# Patient Record
Sex: Female | Born: 1980 | Race: White | Hispanic: No | Marital: Single | Smoking: Current every day smoker
Health system: Southern US, Community
[De-identification: ages and names within clinical notes are randomized; demographics above are authoritative.]

## PROBLEM LIST (undated history)

## (undated) DIAGNOSIS — F17200 Nicotine dependence, unspecified, uncomplicated: Secondary | ICD-10-CM

## (undated) DIAGNOSIS — F32A Depression, unspecified: Secondary | ICD-10-CM

## (undated) HISTORY — DX: Nicotine dependence, unspecified, uncomplicated: F17.200

## (undated) HISTORY — DX: Depression, unspecified: F32.A

---

## 2004-02-26 ENCOUNTER — Emergency Department (HOSPITAL_COMMUNITY): Admission: EM | Admit: 2004-02-26 | Discharge: 2004-02-26 | Payer: Self-pay | Admitting: Emergency Medicine

## 2004-06-29 ENCOUNTER — Emergency Department (HOSPITAL_COMMUNITY): Admission: EM | Admit: 2004-06-29 | Discharge: 2004-06-29 | Payer: Self-pay | Admitting: Emergency Medicine

## 2004-11-06 ENCOUNTER — Emergency Department (HOSPITAL_COMMUNITY): Admission: EM | Admit: 2004-11-06 | Discharge: 2004-11-07 | Payer: Self-pay | Admitting: Emergency Medicine

## 2013-08-28 ENCOUNTER — Ambulatory Visit: Payer: Self-pay | Admitting: Internal Medicine

## 2014-07-10 DIAGNOSIS — E663 Overweight: Secondary | ICD-10-CM | POA: Insufficient documentation

## 2014-07-25 DIAGNOSIS — R87619 Unspecified abnormal cytological findings in specimens from cervix uteri: Secondary | ICD-10-CM | POA: Insufficient documentation

## 2014-08-09 DIAGNOSIS — F411 Generalized anxiety disorder: Secondary | ICD-10-CM | POA: Insufficient documentation

## 2014-08-09 DIAGNOSIS — Z975 Presence of (intrauterine) contraceptive device: Secondary | ICD-10-CM | POA: Insufficient documentation

## 2014-09-26 DIAGNOSIS — D069 Carcinoma in situ of cervix, unspecified: Secondary | ICD-10-CM | POA: Insufficient documentation

## 2017-09-02 DIAGNOSIS — N879 Dysplasia of cervix uteri, unspecified: Secondary | ICD-10-CM | POA: Insufficient documentation

## 2017-12-02 ENCOUNTER — Encounter: Payer: Self-pay | Admitting: Emergency Medicine

## 2017-12-02 ENCOUNTER — Emergency Department
Admission: EM | Admit: 2017-12-02 | Discharge: 2017-12-02 | Disposition: A | Discharge status: Home / Self Care | Payer: Self-pay | Attending: Emergency Medicine | Admitting: Emergency Medicine

## 2017-12-02 ENCOUNTER — Other Ambulatory Visit: Payer: Self-pay

## 2017-12-02 ENCOUNTER — Emergency Department: Payer: Self-pay

## 2017-12-02 DIAGNOSIS — R42 Dizziness and giddiness: Secondary | ICD-10-CM | POA: Insufficient documentation

## 2017-12-02 DIAGNOSIS — F17200 Nicotine dependence, unspecified, uncomplicated: Secondary | ICD-10-CM | POA: Insufficient documentation

## 2017-12-02 DIAGNOSIS — F419 Anxiety disorder, unspecified: Secondary | ICD-10-CM | POA: Insufficient documentation

## 2017-12-02 DIAGNOSIS — R5381 Other malaise: Secondary | ICD-10-CM | POA: Insufficient documentation

## 2017-12-02 DIAGNOSIS — R0789 Other chest pain: Secondary | ICD-10-CM | POA: Insufficient documentation

## 2017-12-02 DIAGNOSIS — R5383 Other fatigue: Secondary | ICD-10-CM | POA: Insufficient documentation

## 2017-12-02 LAB — CBC
HCT: 40.3 % (ref 35.0–47.0)
Hemoglobin: 13.6 g/dL (ref 12.0–16.0)
MCH: 30.8 pg (ref 26.0–34.0)
MCHC: 33.8 g/dL (ref 32.0–36.0)
MCV: 91.3 fL (ref 80.0–100.0)
Platelets: 301 10*3/uL (ref 150–440)
RBC: 4.42 MIL/uL (ref 3.80–5.20)
RDW: 13.2 % (ref 11.5–14.5)
WBC: 5.3 10*3/uL (ref 3.6–11.0)

## 2017-12-02 LAB — COMPREHENSIVE METABOLIC PANEL
ALT: 16 U/L (ref 14–54)
AST: 20 U/L (ref 15–41)
Albumin: 4.6 g/dL (ref 3.5–5.0)
Alkaline Phosphatase: 53 U/L (ref 38–126)
Anion gap: 9 (ref 5–15)
BUN: 11 mg/dL (ref 6–20)
CO2: 25 mmol/L (ref 22–32)
Calcium: 9.5 mg/dL (ref 8.9–10.3)
Chloride: 104 mmol/L (ref 101–111)
Creatinine, Ser: 0.77 mg/dL (ref 0.44–1.00)
GFR calc Af Amer: >60 mL/min (ref >60)
GFR calc non Af Amer: >60 mL/min (ref >60)
Glucose, Bld: 84 mg/dL (ref 65–99)
Potassium: 3.7 mmol/L (ref 3.5–5.1)
Sodium: 138 mmol/L (ref 135–145)
Total Bilirubin: 0.5 mg/dL (ref 0.3–1.2)
Total Protein: 7.8 g/dL (ref 6.5–8.1)

## 2017-12-02 LAB — TROPONIN I: Troponin I: <0.03 ng/mL (ref <0.03)

## 2017-12-02 NOTE — Discharge Instructions (Signed)
You have been seen in the Emergency Department (ED) today for chest pain as well as generalized pain throughout your body.  As we have discussed today?s test results are normal, and we believe your pain is due to pain/strain and/or inflammation of the muscles and/or cartilage of your chest wall, but stress and anxiety likely play a major role.  We recommend you take ibuprofen 600 mg three times a day with meals for the next 5 days (unless you have been told previously not to take ibuprofen or NSAIDs in general).  You may also take Tylenol according to the label instructions.  Read through the included information for additional treatment recommendations and precautions.  Continue to take your regular medications.   Return to the Emergency Department (ED) if you experience any further chest pain/pressure/tightness, difficulty breathing, or sudden sweating, or other symptoms that concern you.

## 2017-12-02 NOTE — ED Notes (Signed)
Pt reports chest discomfort, cough and back pain.  Sx for 2-3 days.  No fever.  cig smoker.  Pt alert.

## 2017-12-02 NOTE — ED Provider Notes (Signed)
[facility] Emergency Department Provider Note  ____________________________________________   First MD Initiated Contact with Patient 12/02/17 2144     (approximate)  I have reviewed the triage vital signs and the nursing notes.   HISTORY  Chief Complaint Chest Pain    HPI Emily Rojas is a 37 y.o. female with no significant past medical history who presents for evaluation of a variety of complaints.  Most notably she complains of tenderness all across her anterior upper chest wall, but in general she complains of at least a week of general malaise, fatigue, pain all over her body, occasional dizziness.  She has never had symptoms like this before which concerned her, but she thinks it may be due to anxiety and stress.  She is going through difficult time with the father of her child and additional stress in other areas, and she said that her symptoms seem similar to her sister who suffered from panic attacks.  Nothing in particular makes his symptoms better or worse although the chest pain seemed to be worse after doing a repetitive motion all day at work yesterday.  She denies fever/chills, neck pain, shortness of breath, cough, nausea, vomiting, and abdominal pain, although she says her whole body feels achy.  She says that her symptoms seem worse when she is dealing with the situation regarding her son's father.  History reviewed. No pertinent past medical history.  There are no active problems to display for this patient.   History reviewed. No pertinent surgical history.  Prior to Admission medications   Not on File    Allergies Patient has no known allergies.  History reviewed. No pertinent family history.  Social History Social History   Tobacco Use  . Smoking status: Current Every Day Smoker  . Smokeless tobacco: Never Used  Substance Use Topics  . Alcohol use: Not on file  . Drug use: Not on file    Review of  Systems Constitutional: No fever/chills.  General malaise and fatigue and myalgia as described above Eyes: No visual changes. ENT: No sore throat. Cardiovascular: Upper anterior chest wall tenderness as described above Respiratory: Denies shortness of breath. Gastrointestinal: No abdominal pain.  No nausea, no vomiting.  No diarrhea.  No constipation. Genitourinary: Negative for dysuria. Musculoskeletal: Negative for neck pain.  Negative for back pain. Integumentary: Negative for rash. Neurological: Negative for headaches, focal weakness or numbness. Psychiatric:Anxiety but adamantly denies depression ____________________________________________   PHYSICAL EXAM:  VITAL SIGNS: ED Triage Vitals  Enc Vitals Group     BP 12/02/17 2037 120/86     Pulse Rate 12/02/17 2037 75     Resp 12/02/17 2037 20     Temp 12/02/17 2037 98.1 F (36.7 C)     Temp Source 12/02/17 2037 Oral     SpO2 12/02/17 2037 100 %     Weight 12/02/17 2035 88.5 kg (195 lb)     Height 12/02/17 2035 1.702 m (5\' 7" )     Head Circumference --      Peak Flow --      Pain Score 12/02/17 2035 10     Pain Loc --      Pain Edu? --      Excl. in GC? --     Constitutional: Alert and oriented. Well appearing and in no acute distress. Eyes: Conjunctivae are normal.  Head: Atraumatic. Nose: No congestion/rhinnorhea. Mouth/Throat: Mucous membranes are moist. Neck: No stridor.  No meningeal signs.   Cardiovascular: Normal rate,  regular rhythm. Good peripheral circulation. Grossly normal heart sounds.  Highly reproducible upper anterior chest wall tenderness to palpation on the right side Respiratory: Normal respiratory effort.  No retractions. Lungs CTAB. Gastrointestinal: Soft and nontender. No distention.  Musculoskeletal: No lower extremity tenderness nor edema. No gross deformities of extremities. Neurologic:  Normal speech and language. No gross focal neurologic deficits are appreciated.  Skin:  Skin is warm, dry  and intact. No rash noted. Psychiatric: Mood and affect seem depressed although the patient denies that adamantly.  Somewhat anxious but appropriate under the social circumstances  ____________________________________________   LABS (all labs ordered are listed, but only abnormal results are displayed)  Labs Reviewed  CBC  COMPREHENSIVE METABOLIC PANEL  TROPONIN I   ____________________________________________  EKG  ED ECG REPORT I, Loleta Roseory Savas Elvin, the attending physician, personally viewed and interpreted this ECG.  Date: 12/02/2017 EKG Time: 20: 32 Rate: 68 Rhythm: normal sinus rhythm QRS Axis: normal Intervals: normal ST/T Wave abnormalities: normal Narrative Interpretation: no evidence of acute ischemia  ____________________________________________  RADIOLOGY   ED MD interpretation: No indication of active disease or emergent medical condition  Official radiology report(s): Dg Chest 2 View  Result Date: 12/02/2017 CLINICAL DATA:  Chest pain EXAM: CHEST - 2 VIEW COMPARISON:  None. FINDINGS: The heart size and mediastinal contours are within normal limits. Both lungs are clear. The visualized skeletal structures are unremarkable. IMPRESSION: No active cardiopulmonary disease. Electronically Signed   By: Alcide CleverMark  Lukens M.D.   On: 12/02/2017 20:58    ____________________________________________   PROCEDURES  Critical Care performed: No   Procedure(s) performed:   Procedures   ____________________________________________   INITIAL IMPRESSION / ASSESSMENT AND PLAN / ED COURSE  As part of my medical decision making, I reviewed the following data within the electronic MEDICAL RECORD NUMBER Nursing notes reviewed and incorporated, Labs reviewed  and EKG interpreted     Differential diagnosis includes, but is not limited to, musculoskeletal tenderness, anxiety/panic attacks, depression, less likely ACS, pulmonary embolism, pneumonia, pericarditis.  Her lab work is all  within normal limits including a negative troponin, her EKG is normal, and her chest x-ray is normal.  We talked about her symptoms for a while on a bout of the possibility of depression as well as her suggestion of anxiety and panic attacks.  She is adamant that she is not depressed but I pointed out she could be depressed and not realize it.  She seemed to feel better after we talked about all of her symptoms and issues for a while and she felt reassured that her workup was normal.  She is low risk on HEART score and PERC negative.  There is no indication of any emergent medical condition at this time and she is comfortable with the plan for discharge and outpatient follow-up.  I gave my usual and customary return precautions.      ____________________________________________  FINAL CLINICAL IMPRESSION(S) / ED DIAGNOSES  Final diagnoses:  Chest wall pain  Malaise and fatigue  Anxiety     MEDICATIONS GIVEN DURING THIS VISIT:  Medications - No data to display   ED Discharge Orders    None       Note:  This document was prepared using Dragon voice recognition software and may include unintentional dictation errors.    Loleta RoseForbach, Fontaine Hehl, MD 12/02/17 2324

## 2017-12-02 NOTE — ED Triage Notes (Signed)
Pt in with co cp that started today with some dizziness. No hx of heart disease, and recentl illness.

## 2018-11-13 IMAGING — CR DG CHEST 2V
2 series · 2 of 2 positions shown · non-contrast
Comparison: None.

CLINICAL DATA: Chest pain

EXAM:
CHEST - 2 VIEW

[chest pa]
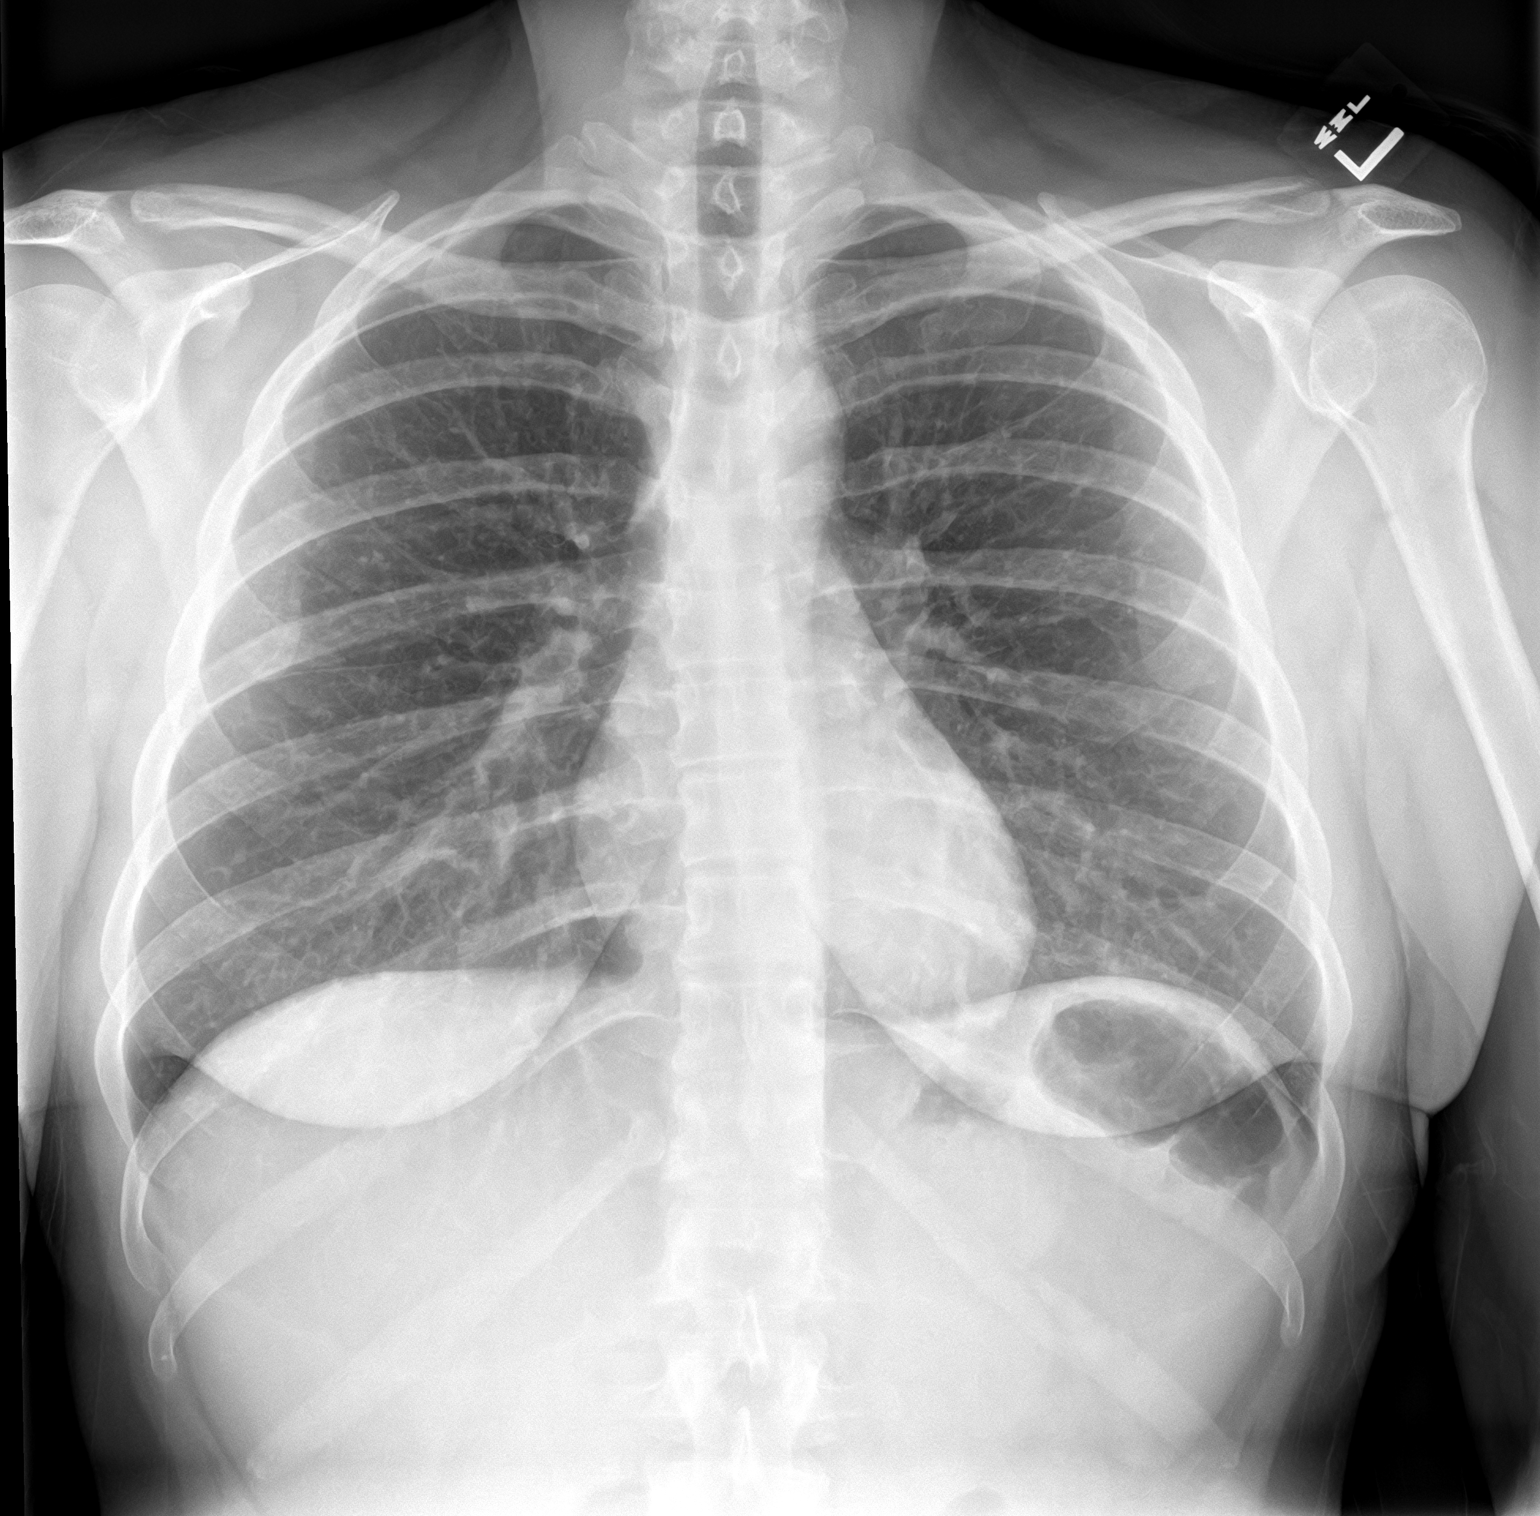

[chest lat]
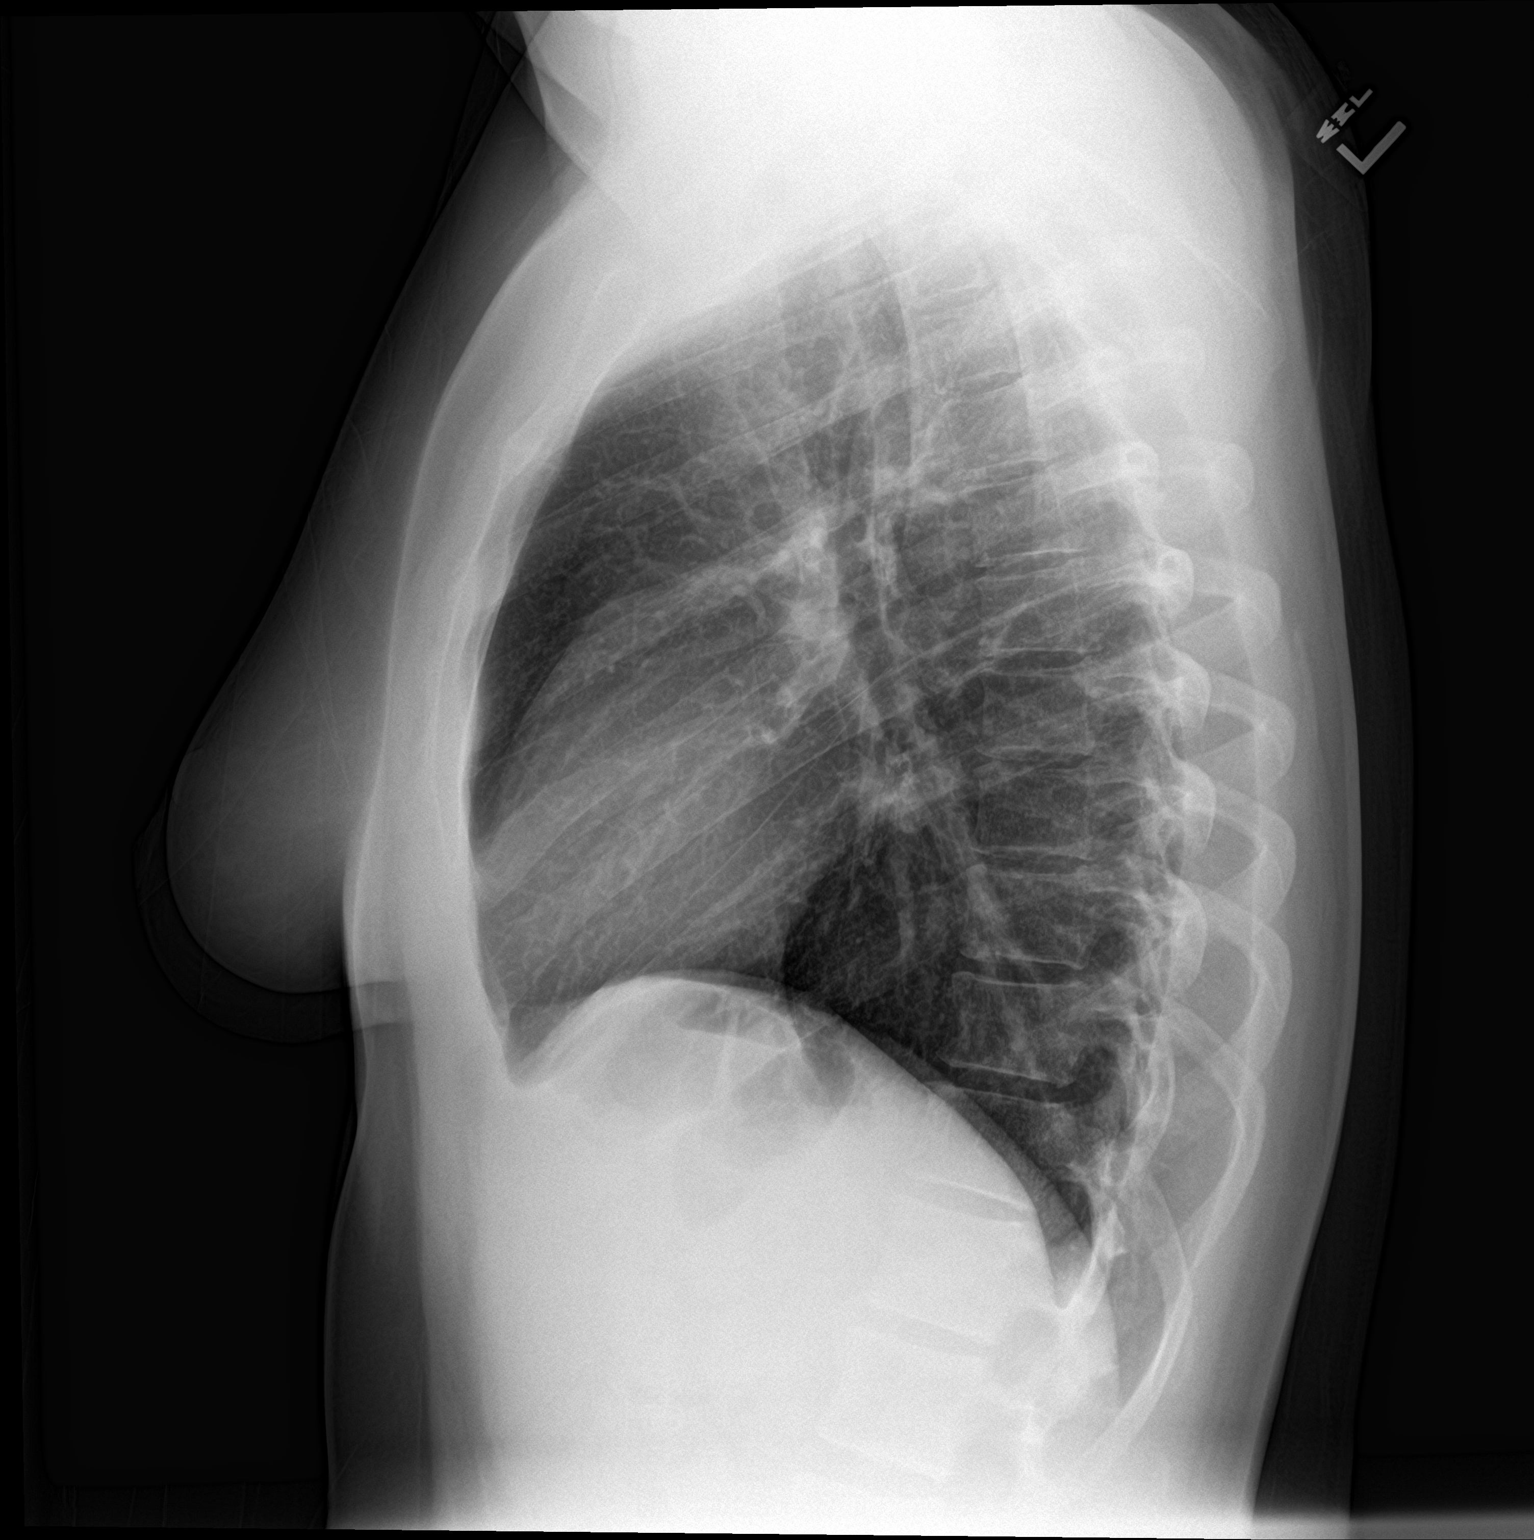

[2 of 2 positions shown; findings below may reference images not displayed]

FINDINGS: The heart size and mediastinal contours are within normal limits.
Both lungs are clear. The visualized skeletal structures are
unremarkable.
IMPRESSION: No active cardiopulmonary disease.

## 2019-07-26 ENCOUNTER — Other Ambulatory Visit: Payer: Self-pay

## 2019-07-26 DIAGNOSIS — Z20822 Contact with and (suspected) exposure to covid-19: Secondary | ICD-10-CM

## 2019-07-28 LAB — NOVEL CORONAVIRUS, NAA: SARS-CoV-2, NAA: NOT DETECTED

## 2020-01-10 ENCOUNTER — Other Ambulatory Visit: Payer: Self-pay | Admitting: Podiatry

## 2020-01-10 ENCOUNTER — Ambulatory Visit (INDEPENDENT_AMBULATORY_CARE_PROVIDER_SITE_OTHER): Payer: Medicaid Other

## 2020-01-10 ENCOUNTER — Ambulatory Visit: Payer: Medicaid Other

## 2020-01-10 ENCOUNTER — Ambulatory Visit (INDEPENDENT_AMBULATORY_CARE_PROVIDER_SITE_OTHER): Payer: Self-pay | Admitting: Podiatry

## 2020-01-10 ENCOUNTER — Other Ambulatory Visit: Payer: Self-pay

## 2020-01-10 DIAGNOSIS — Q666 Other congenital valgus deformities of feet: Secondary | ICD-10-CM

## 2020-01-10 DIAGNOSIS — M79671 Pain in right foot: Secondary | ICD-10-CM

## 2020-01-10 DIAGNOSIS — M7742 Metatarsalgia, left foot: Secondary | ICD-10-CM

## 2020-01-10 DIAGNOSIS — M7741 Metatarsalgia, right foot: Secondary | ICD-10-CM

## 2020-01-10 DIAGNOSIS — M79672 Pain in left foot: Secondary | ICD-10-CM

## 2020-01-10 DIAGNOSIS — M722 Plantar fascial fibromatosis: Secondary | ICD-10-CM

## 2020-01-10 DIAGNOSIS — F17200 Nicotine dependence, unspecified, uncomplicated: Secondary | ICD-10-CM | POA: Insufficient documentation

## 2020-01-11 ENCOUNTER — Encounter: Payer: Self-pay | Admitting: Podiatry

## 2020-01-11 NOTE — Progress Notes (Signed)
  Subjective:  Patient ID: Emily Rojas, female    DOB: 1980/10/09,  MRN: [id number]  Chief Complaint  Patient presents with  . Foot Pain    pt is here for bil foot pain/ swelling    39 y.o. female presents with the above complaint.  Patient presents with general complaint of bilateral foot swelling and pain associated with to dorsal and plantar aspect of the forefoot.  Patient states been going for 3 years.  She is constantly on her foot her work is demanding with concrete surfaces.  She states there is burning sensation associated with it.  Pain scale 7 out of 10.  She would like to know if there is any treatment options available for this for the amount of pain that she is having.  She wears regular sneakers that are nonsupportive.  She denies any other acute complaints.   Review of Systems: Negative except as noted in the HPI. Denies N/V/F/Ch.  No past medical history on file.  Current Outpatient Medications:  .  ibuprofen (ADVIL) 800 MG tablet, Take by mouth., Disp: , Rfl:  .  ibuprofen (ADVIL) 200 MG tablet, Take by mouth., Disp: , Rfl:   Social History   Tobacco Use  Smoking Status Current Every Day Smoker  Smokeless Tobacco Never Used    No Known Allergies Objective:  There were no vitals filed for this visit. There is no height or weight on file to calculate BMI. Constitutional Well developed. Well nourished.  Vascular Dorsalis pedis pulses palpable bilaterally. Posterior tibial pulses palpable bilaterally. Capillary refill normal to all digits.  No cyanosis or clubbing noted. Pedal hair growth normal.  Neurologic Normal speech. Oriented to person, place, and time. Epicritic sensation to light touch grossly present bilaterally.  Dermatologic Nails well groomed and normal in appearance. No open wounds. No skin lesions.  Orthopedic:  Generalized pain to the dorsal and plantar forefoot.  No pain along the posterior tibial tendon, Achilles tendon, ATFL, peroneal  tendon bilaterally.  No pain with extensor and flexor dorsiflexion and plantarflexion of the digits bilaterally.  Pain at the metatarsophalangeal joint of all the digits bilaterally.  No intra-articular pain assessed.   Radiographs: 3 views of skeletally mature adult bilateral foot:There is slight decrease in calcaneal inclination angle increase in talar declination angle anterior break in the cyma line.  No bony abnormalities noted.  Mild bunion deformity noted.  Mild elevatus noted.  These findings are consistent for bilateral.  Assessment:   1. Foot pain, bilateral   2. Pes planovalgus   3. Metatarsalgia of both feet    Plan:  Patient was evaluated and treated and all questions answered.  Bilateral metatarsalgia secondary to semiflexible pes planus deformity -I explained to the patient the etiology of metatarsalgia and various treatment options were extensively discussed.  I believe patient will benefit from custom-made orthotics to help support the arch of the foot as well as control the hindfoot motion.  I also believe she will benefit from metatarsal pad to support the metatarsal head during ambulation. -I also discussed with your shoe gear modification with night supportive sneakers. -Prescription for orthotics were given to the patient to be obtained at [facility].  No follow-ups on file.

## 2020-03-06 ENCOUNTER — Ambulatory Visit: Payer: Self-pay | Admitting: Podiatry

## 2020-11-02 DIAGNOSIS — Z113 Encounter for screening for infections with a predominantly sexual mode of transmission: Secondary | ICD-10-CM | POA: Diagnosis not present

## 2020-11-02 DIAGNOSIS — Z30433 Encounter for removal and reinsertion of intrauterine contraceptive device: Secondary | ICD-10-CM | POA: Diagnosis not present

## 2021-07-29 ENCOUNTER — Emergency Department
Admission: EM | Admit: 2021-07-29 | Discharge: 2021-07-29 | Disposition: A | Discharge status: Home / Self Care | Payer: Medicaid Other | Attending: Emergency Medicine | Admitting: Emergency Medicine

## 2021-07-29 ENCOUNTER — Other Ambulatory Visit: Payer: Self-pay

## 2021-07-29 DIAGNOSIS — M25531 Pain in right wrist: Secondary | ICD-10-CM

## 2021-07-29 DIAGNOSIS — M25431 Effusion, right wrist: Secondary | ICD-10-CM | POA: Insufficient documentation

## 2021-07-29 DIAGNOSIS — F17200 Nicotine dependence, unspecified, uncomplicated: Secondary | ICD-10-CM | POA: Insufficient documentation

## 2021-07-29 MED ORDER — PREDNISONE 10 MG (21) PO TBPK: ORAL_TABLET | ORAL | 0 refills | Status: DC

## 2021-07-29 MED ORDER — NAPROXEN 500 MG PO TABS: 500.0000 mg | ORAL_TABLET | Freq: Two times a day (BID) | ORAL | 0 refills | Status: AC

## 2021-07-29 NOTE — Discharge Instructions (Addendum)
Start by using a wrist brace only while sleeping to see if this alleviates your pain.  If needed during the day, avoid wearing the brace more than necessary.  After pain improves, start doing wrist exercises to improve mobility and muscle strength.

## 2021-07-29 NOTE — ED Triage Notes (Signed)
Pt c/o right hand pain and swelling since last night, pt states "I think Its carpal tunnel" pt has a brace on both wrist on arrival ,denies injury.. "its from over use"

## 2021-07-29 NOTE — ED Provider Notes (Signed)
[facility] Emergency Department Provider Note  ____________________________________________  Time seen: Approximately 11:55 AM  I have reviewed the triage vital signs and the nursing notes.   HISTORY  Chief Complaint Hand Pain    HPI Emily Rojas is a 40 y.o. female with a history of anxiety, left wrist carpal tunnel who comes ED complaining of right wrist pain since waking up this morning.  She feels is related to overuse due to using the right hand for more things since her left hand has been limited by the carpal tunnel.  No trauma, no fever, no wounds.  Worse with movement. No unusual GU symptoms .    History reviewed. No pertinent past medical history.   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 No pertinent surgical history.   Prior to Admission medications   Medication Sig Start Date End Date Taking? Authorizing Provider  naproxen (NAPROSYN) 500 MG tablet Take 1 tablet (500 mg total) by mouth 2 (two) times daily with a meal. 07/29/21  Yes Sharman Cheek, MD  predniSONE (STERAPRED UNI-PAK 21 TAB) 10 MG (21) TBPK tablet 6 tablets on day 1, then 5 tablets on day 2, then 4 tablets on day 3, then 3 tablets on day 4, then 2 tablets on day 5, then 1 tablet on day 6. 07/29/21  Yes Sharman Cheek, MD  ibuprofen (ADVIL) 200 MG tablet Take by mouth.    [provider]  ibuprofen (ADVIL) 800 MG tablet Take by mouth.    [provider]     Allergies Patient has no known allergies.   No family history on file.  Social History Social History   Tobacco Use   Smoking status: Every Day    Smokeless tobacco: Never    Review of Systems  Constitutional:   No fever or chills.  ENT:   No sore throat. No rhinorrhea. Cardiovascular:   No chest pain or syncope. Respiratory:   No dyspnea or cough. Gastrointestinal:   Negative for abdominal pain, vomiting and diarrhea.  Musculoskeletal: Positive right wrist pain and swelling  all other systems reviewed and are negative except as documented above in ROS and HPI.  ____________________________________________   PHYSICAL EXAM:  VITAL SIGNS: ED Triage Vitals [07/29/21 0938]  Enc Vitals Group     BP (!) 131/101     Pulse Rate 81     Resp 16     Temp 98 F (36.7 C)     Temp Source Oral     SpO2 (!) 7 %     Weight      Height      Head Circumference      Peak Flow      Pain Score      Pain Loc      Pain Edu?      Excl. in GC?     Vital signs reviewed, nursing assessments reviewed.   Constitutional:   Alert and oriented. Non-toxic appearance. Eyes:   Conjunctivae are normal. EOMI. ENT      Head:   Normocephalic and atraumatic.      Mouth/Throat:   MMM      Neck:   No  meningismus. Full ROM. Hematological/Lymphatic/Immunilogical:   No cervical lymphadenopathy. Cardiovascular:   RRR.  Cap refill less than 2 seconds. Respiratory:   Normal respiratory effort without tachypnea/retractions.  . Musculoskeletal:   Diffuse swelling around the right wrist.  No erythema or warmth.  No focal tenderness, no wounds, no rash.  Pain with percussion over the median nerve in the forearm which radiates into the hand.  She is able to tolerate passive range of motion to a limited degree. Neurologic:   Normal speech and language.  Motor grossly intact. No acute focal neurologic deficits are appreciated.  Skin:    Skin is warm, dry and intact. No rash noted.  No wounds.  ____________________________________________    LABS (pertinent positives/negatives) (all labs ordered are listed, but only abnormal results are displayed) Labs  Reviewed - No data to display ____________________________________________   EKG  ____________________________________________    RADIOLOGY  No results found.  ____________________________________________   PROCEDURES Procedures  ____________________________________________  CLINICAL IMPRESSION / ASSESSMENT AND PLAN / ED COURSE  Pertinent labs & imaging results that were available during my care of the patient were reviewed by me and considered in my medical decision making (see chart for details).  Emily Rojas was evaluated in Emergency Department on 07/29/2021 for the symptoms described in the history of present illness. She was evaluated in the context of the global COVID-19 pandemic, which necessitated consideration that the patient might be at risk for infection with the SARS-CoV-2 virus that causes COVID-19. Institutional protocols and algorithms that pertain to the evaluation of patients at risk for COVID-19 are in a state of rapid change based on information released by regulatory bodies including the CDC and federal and state organizations. These policies and algorithms were followed during the patient's care in the ED.   Patient presents with right wrist pain, consistent with carpal tunnel syndrome.  No evidence of septic arthritis, fracture, dislocation, abscess, soft tissue infection, gonorrhea infection.  Counseled on conservative measures, referral to orthopedics, given prescription discount card and open-door clinic information.       ____________________________________________   FINAL CLINICAL IMPRESSION(S) / ED DIAGNOSES    Final diagnoses:  Acute pain of right wrist     ED Discharge Orders          Ordered    predniSONE (STERAPRED UNI-PAK 21 TAB) 10 MG (21) TBPK tablet        07/29/21 1154    naproxen (NAPROSYN) 500 MG tablet  2 times daily with meals        07/29/21 1154            Portions of this note were generated with dragon  dictation software. Dictation errors may occur despite best attempts at proofreading.   Sharman Cheek, MD 07/29/21 1209

## 2022-08-08 DIAGNOSIS — Z419 Encounter for procedure for purposes other than remedying health state, unspecified: Secondary | ICD-10-CM | POA: Diagnosis not present

## 2022-09-08 DIAGNOSIS — Z419 Encounter for procedure for purposes other than remedying health state, unspecified: Secondary | ICD-10-CM | POA: Diagnosis not present

## 2022-10-09 DIAGNOSIS — Z419 Encounter for procedure for purposes other than remedying health state, unspecified: Secondary | ICD-10-CM | POA: Diagnosis not present

## 2022-10-17 DIAGNOSIS — M25532 Pain in left wrist: Secondary | ICD-10-CM | POA: Diagnosis not present

## 2022-10-17 DIAGNOSIS — G5602 Carpal tunnel syndrome, left upper limb: Secondary | ICD-10-CM | POA: Diagnosis not present

## 2022-10-17 DIAGNOSIS — M25531 Pain in right wrist: Secondary | ICD-10-CM | POA: Diagnosis not present

## 2022-10-24 DIAGNOSIS — M25532 Pain in left wrist: Secondary | ICD-10-CM | POA: Diagnosis not present

## 2022-10-24 DIAGNOSIS — M25531 Pain in right wrist: Secondary | ICD-10-CM | POA: Diagnosis not present

## 2022-10-24 DIAGNOSIS — G5602 Carpal tunnel syndrome, left upper limb: Secondary | ICD-10-CM | POA: Diagnosis not present

## 2022-11-07 DIAGNOSIS — Z419 Encounter for procedure for purposes other than remedying health state, unspecified: Secondary | ICD-10-CM | POA: Diagnosis not present

## 2022-11-27 DIAGNOSIS — M2550 Pain in unspecified joint: Secondary | ICD-10-CM | POA: Diagnosis not present

## 2022-11-27 DIAGNOSIS — G8929 Other chronic pain: Secondary | ICD-10-CM | POA: Diagnosis not present

## 2022-11-27 DIAGNOSIS — M19232 Secondary osteoarthritis, left wrist: Secondary | ICD-10-CM | POA: Diagnosis not present

## 2022-11-27 DIAGNOSIS — G5602 Carpal tunnel syndrome, left upper limb: Secondary | ICD-10-CM | POA: Diagnosis not present

## 2022-12-08 DIAGNOSIS — Z419 Encounter for procedure for purposes other than remedying health state, unspecified: Secondary | ICD-10-CM | POA: Diagnosis not present

## 2022-12-16 DIAGNOSIS — G8929 Other chronic pain: Secondary | ICD-10-CM | POA: Diagnosis not present

## 2022-12-16 DIAGNOSIS — M2550 Pain in unspecified joint: Secondary | ICD-10-CM | POA: Diagnosis not present

## 2023-01-07 DIAGNOSIS — Z419 Encounter for procedure for purposes other than remedying health state, unspecified: Secondary | ICD-10-CM | POA: Diagnosis not present

## 2023-01-19 DIAGNOSIS — M25511 Pain in right shoulder: Secondary | ICD-10-CM | POA: Diagnosis not present

## 2023-01-19 DIAGNOSIS — G5602 Carpal tunnel syndrome, left upper limb: Secondary | ICD-10-CM | POA: Diagnosis not present

## 2023-01-19 DIAGNOSIS — G8929 Other chronic pain: Secondary | ICD-10-CM | POA: Diagnosis not present

## 2023-01-19 DIAGNOSIS — M249 Joint derangement, unspecified: Secondary | ICD-10-CM | POA: Diagnosis not present

## 2023-02-07 DIAGNOSIS — Z419 Encounter for procedure for purposes other than remedying health state, unspecified: Secondary | ICD-10-CM | POA: Diagnosis not present

## 2023-03-09 DIAGNOSIS — Z419 Encounter for procedure for purposes other than remedying health state, unspecified: Secondary | ICD-10-CM | POA: Diagnosis not present

## 2023-03-11 DIAGNOSIS — H0288A Meibomian gland dysfunction right eye, upper and lower eyelids: Secondary | ICD-10-CM | POA: Diagnosis not present

## 2023-04-09 DIAGNOSIS — Z419 Encounter for procedure for purposes other than remedying health state, unspecified: Secondary | ICD-10-CM | POA: Diagnosis not present

## 2023-05-10 DIAGNOSIS — Z419 Encounter for procedure for purposes other than remedying health state, unspecified: Secondary | ICD-10-CM | POA: Diagnosis not present

## 2023-06-09 DIAGNOSIS — Z419 Encounter for procedure for purposes other than remedying health state, unspecified: Secondary | ICD-10-CM | POA: Diagnosis not present

## 2023-06-18 DIAGNOSIS — Z03818 Encounter for observation for suspected exposure to other biological agents ruled out: Secondary | ICD-10-CM | POA: Diagnosis not present

## 2023-06-18 DIAGNOSIS — R058 Other specified cough: Secondary | ICD-10-CM | POA: Diagnosis not present

## 2023-06-18 DIAGNOSIS — R0789 Other chest pain: Secondary | ICD-10-CM | POA: Diagnosis not present

## 2023-06-18 DIAGNOSIS — R0981 Nasal congestion: Secondary | ICD-10-CM | POA: Diagnosis not present

## 2023-07-10 DIAGNOSIS — Z419 Encounter for procedure for purposes other than remedying health state, unspecified: Secondary | ICD-10-CM | POA: Diagnosis not present

## 2023-08-09 DIAGNOSIS — Z419 Encounter for procedure for purposes other than remedying health state, unspecified: Secondary | ICD-10-CM | POA: Diagnosis not present

## 2023-08-13 ENCOUNTER — Encounter: Payer: Self-pay | Admitting: Neurology

## 2023-08-17 ENCOUNTER — Encounter: Payer: Self-pay | Admitting: Neurology

## 2023-08-17 ENCOUNTER — Ambulatory Visit: Payer: Medicaid Other | Admitting: Neurology

## 2023-08-17 VITALS — BP 117/89 | HR 87 | Resp 15 | Ht 66.75 in | Wt 203.0 lb

## 2023-08-17 DIAGNOSIS — M791 Myalgia, unspecified site: Secondary | ICD-10-CM

## 2023-08-17 DIAGNOSIS — R202 Paresthesia of skin: Secondary | ICD-10-CM | POA: Diagnosis not present

## 2023-08-17 MED ORDER — DULOXETINE HCL 30 MG PO CPEP: 30.0000 mg | ORAL_CAPSULE | Freq: Every day | ORAL | 0 refills | Status: DC

## 2023-08-17 MED ORDER — DULOXETINE HCL 60 MG PO CPEP: 60.0000 mg | ORAL_CAPSULE | Freq: Every day | ORAL | 3 refills | Status: DC

## 2023-08-17 NOTE — Patient Instructions (Signed)
Meds ordered this encounter  Medications   DULoxetine (CYMBALTA) 30 MG capsule    Sig: Take 1 capsule (30 mg total) by mouth daily.    Dispense:  30 capsule    Refill:  0    Fill 30mg  first then 60mg    DULoxetine (CYMBALTA) 60 MG capsule    Sig: Take 1 capsule (60 mg total) by mouth daily.    Dispense:  90 capsule    Refill:  3

## 2023-08-17 NOTE — Progress Notes (Signed)
Chief Complaint  Patient presents with   New Patient (Initial Visit)    Rm12, alone, referral for Left CTS vs radiculopathy from Kennedy Bucker MD [facility]: pt stated that  left arm and hand swells, burns, tingles, numb      ASSESSMENT AND PLAN  Emily Rojas is a 42 y.o. female   Multiple small joints pain, swelling,  Essentially normal neurological examination, mild gait abnormality is limited due to plantar foot pain,  Laboratory evaluation including inflammatory markers,  She cannot tolerate EMG nerve conduction study, history are not consistent with peripheral neuropathy, cervical/lumbar radiculopathy,  Add on Cymbalta 30 mg titrating to 60 mg daily,  If any of the laboratory evaluation showed abnormality, may continue follow-up PCP or rheumatologist  DIAGNOSTIC DATA (LABS, IMAGING, TESTING) - I reviewed patient records, labs, notes, testing and imaging myself where available.   MEDICAL HISTORY:  Emily Rojas, is a 42 year old right-handed female, seen in request by sports medicine Dr. Kennedy Bucker for evaluation of wrist pain, her primary care physician is Dr. Dareen Piano, Marya Amsler, initial evaluation was on August 17, 2023  History is obtained from the patient and review of electronic medical records. I personally reviewed pertinent available imaging films in PACS.   PMHx of  Daily over the counter Analgesic, Tylenol, ibuprofen or Excedrin, ADHD Smoke  She cleans houses, average 4 hours each day, around 2017, she began to experience left wrist pain, gradually getting worse over the past few years, she has to restrain from using her left hand and arm, rely on her right hand heavily, now she developed right hands joints pain, swelling  She also complains of diffuse body achy pain, bilateral plantar surface pain, occasionally numbness, denies neck pain, denies low back pain no bowel or bladder incontinence, cautious gait she contributed to bilateral  plantar surface pain bearing weight  She was seen by sports physician Dr. Rosita Kea in May 2024, attempted EMG nerve conduction study, she was not able to tolerate the study  X ray of right shoulder in May 2024: These show minimal AC degenerative change, normal subacromial space, normal glenohumeral space. No chronic changes at the tuberosity.   Laboratory evaluation in the [facility] system in April 2024, showed positive rheumatoid factor 42.6, normal C-reactive protein of 5, ESR of 7,  PHYSICAL EXAM:   Vitals:   08/17/23 1058 08/17/23 1101  BP: (!) 131/102 117/89  Pulse:  87  Resp: 15   Weight: 203 lb (92.1 kg)   Height: 5' 6.75" (1.695 m)      Body mass index is 32.03 kg/m.  PHYSICAL EXAMNIATION:  Gen: NAD, conversant, well nourised, well groomed                     Cardiovascular: Regular rate rhythm, no peripheral edema, warm, nontender. Eyes: Conjunctivae clear without exudates or hemorrhage Neck: Supple, no carotid bruits. Pulmonary: Clear to auscultation bilaterally   NEUROLOGICAL EXAM:  MENTAL STATUS: Speech/cognition: Awake, alert, oriented to history taking and casual conversation CRANIAL NERVES: CN II: Visual fields are full to confrontation. Pupils are round equal and briskly reactive to light. CN III, IV, VI: extraocular movement are normal. No ptosis. CN V: Facial sensation is intact to light touch CN VII: Face is symmetric with normal eye closure  CN VIII: Hearing is normal to causal conversation. CN IX, X: Phonation is normal. CN XI: Head turning and shoulder shrug are intact  MOTOR: Right hand multiple joints swelling, mild tenderness upon  deep palpitation, bilateral wrist pain, left worse than right There is no pronator drift of out-stretched arms. Muscle bulk and tone are normal. Muscle strength is normal.  Significant tenderness of bilateral plantar surface, multiple trigger point tenderness at bilateral arm, and leg  REFLEXES: Reflexes are 2+ and symmetric  at the biceps, triceps, knees, and ankles. Plantar responses are flexor.  SENSORY: Intact to light touch, pinprick and vibratory sensation are intact in fingers and toes.  COORDINATION: There is no trunk or limb dysmetria noted.  GAIT/STANCE: Able to get up from seated position arm crossed, mildly antalgic, able to stand up on heels, not on tiptoe due to foot pain, Romberg is absent.  REVIEW OF SYSTEMS:  Full 14 system review of systems performed and notable only for as above All other review of systems were negative.   ALLERGIES: No Known Allergies  HOME MEDICATIONS: Current Outpatient Medications  Medication Sig Dispense Refill   albuterol (VENTOLIN HFA) 108 (90 Base) MCG/ACT inhaler Inhale 2 puffs into the lungs every 4 (four) hours as needed for shortness of breath or wheezing.     amphetamine-dextroamphetamine (ADDERALL) 20 MG tablet Take 20 mg by mouth 3 (three) times daily.     ibuprofen (ADVIL) 200 MG tablet Take by mouth.     ibuprofen (ADVIL) 800 MG tablet Take by mouth.     naproxen (NAPROSYN) 500 MG tablet Take 1 tablet (500 mg total) by mouth 2 (two) times daily with a meal. 20 tablet 0   No current facility-administered medications for this visit.    PAST MEDICAL HISTORY: Past Medical History:  Diagnosis Date   Depression    Tobacco dependency     PAST SURGICAL HISTORY: History reviewed. No pertinent surgical history.  FAMILY HISTORY: History reviewed. No pertinent family history.  SOCIAL HISTORY: Social History   Socioeconomic History   Marital status: Single    Spouse name: Not on file   Number of children: 1   Years of education: Not on file   Highest education level: Associate degree: occupational, Scientist, product/process development, or vocational program  Occupational History   Not on file  Tobacco Use   Smoking status: Every Day   Smokeless tobacco: Never  Vaping Use   Vaping status: Never Used  Substance and Sexual Activity   Alcohol use: Not Currently   Drug  use: Not Currently   Sexual activity: Not on file  Other Topics Concern   Not on file  Social History Narrative   Not on file   Social Determinants of Health   Financial Resource Strain: Low Risk  (06/18/2023)   [facility]   Overall Financial Resource Strain (CARDIA)    Difficulty of Paying Living Expenses: Not hard at all  Food Insecurity: No Food Insecurity (06/18/2023)   Received from [facility]   Hunger Vital Sign    Worried About Running Out of Food in the Last Year: Never true    Ran Out of Food in the Last Year: Never true  Transportation Needs: No Transportation Needs (06/18/2023)   Received from [facility] - Transportation    In the past 12 months, has lack of transportation kept you from medical appointments or from getting medications?: No    Lack of Transportation (Non-Medical): No  Physical Activity: Not on file  Stress: Not on file  Social Connections: Not on file  Intimate Partner Violence: Not on file      Levert Feinstein,  M.D. Ph.D.  [facility] [address] [place], [place] [address] Ph: [phone] Fax: [phone]  CC:  Kennedy Bucker, MD [address] [facility] [place],  [place] [address]  Lauro Regulus, MD

## 2023-08-19 ENCOUNTER — Other Ambulatory Visit: Payer: Self-pay | Admitting: Neurology

## 2023-08-19 DIAGNOSIS — M791 Myalgia, unspecified site: Secondary | ICD-10-CM

## 2023-08-19 DIAGNOSIS — R202 Paresthesia of skin: Secondary | ICD-10-CM

## 2023-08-20 ENCOUNTER — Telehealth: Payer: Self-pay | Admitting: Neurology

## 2023-08-20 MED ORDER — DULOXETINE HCL 60 MG PO CPEP: 60.0000 mg | ORAL_CAPSULE | Freq: Every day | ORAL | 3 refills | Status: DC

## 2023-08-20 MED ORDER — DULOXETINE HCL 30 MG PO CPEP: 30.0000 mg | ORAL_CAPSULE | Freq: Every day | ORAL | 0 refills | Status: DC

## 2023-08-20 NOTE — Telephone Encounter (Signed)
Pt states DULoxetine (CYMBALTA) 30 MG capsule and DULoxetine (CYMBALTA) 60 MG capsule were due to be sent to [id number] [place], [place] [phone] but pharmacy said they hdo not have them .

## 2023-08-20 NOTE — Telephone Encounter (Signed)
Lmtrc refill sent

## 2023-08-25 LAB — CBC WITH DIFFERENTIAL/PLATELET
Basophils Absolute: 0 10*3/uL (ref 0.0–0.2)
Basos: 1 %
EOS (ABSOLUTE): 0.2 10*3/uL (ref 0.0–0.4)
Eos: 3 %
Hematocrit: 43.9 % (ref 34.0–46.6)
Hemoglobin: 14.5 g/dL (ref 11.1–15.9)
Immature Grans (Abs): 0 10*3/uL (ref 0.0–0.1)
Immature Granulocytes: 0 %
Lymphocytes Absolute: 1.1 10*3/uL (ref 0.7–3.1)
Lymphs: 19 %
MCH: 31.3 pg (ref 26.6–33.0)
MCHC: 33 g/dL (ref 31.5–35.7)
MCV: 95 fL (ref 79–97)
Monocytes Absolute: 0.5 10*3/uL (ref 0.1–0.9)
Monocytes: 10 %
Neutrophils Absolute: 3.8 10*3/uL (ref 1.4–7.0)
Neutrophils: 67 %
Platelets: 515 10*3/uL — ABNORMAL HIGH (ref 150–450)
RBC: 4.63 x10E6/uL (ref 3.77–5.28)
RDW: 12.8 % (ref 11.7–15.4)
WBC: 5.6 10*3/uL (ref 3.4–10.8)

## 2023-08-25 LAB — HGB A1C W/O EAG: Hgb A1c MFr Bld: 5.4 % (ref 4.8–5.6)

## 2023-08-25 LAB — VITAMIN D 25 HYDROXY (VIT D DEFICIENCY, FRACTURES): Vit D, 25-Hydroxy: 26.9 ng/mL — ABNORMAL LOW (ref 30.0–100.0)

## 2023-08-25 LAB — MULTIPLE MYELOMA PANEL, SERUM
Albumin SerPl Elph-Mcnc: 3.9 g/dL (ref 2.9–4.4)
Albumin/Glob SerPl: 1.1 (ref 0.7–1.7)
Alpha 1: 0.2 g/dL (ref 0.0–0.4)
Alpha2 Glob SerPl Elph-Mcnc: 0.8 g/dL (ref 0.4–1.0)
B-Globulin SerPl Elph-Mcnc: 1 g/dL (ref 0.7–1.3)
Gamma Glob SerPl Elph-Mcnc: 1.5 g/dL (ref 0.4–1.8)
Globulin, Total: 3.6 g/dL (ref 2.2–3.9)
IgA/Immunoglobulin A, Serum: 233 mg/dL (ref 87–352)
IgG (Immunoglobin G), Serum: 1525 mg/dL (ref 586–1602)
IgM (Immunoglobulin M), Srm: 98 mg/dL (ref 26–217)

## 2023-08-25 LAB — RHEUMATOID ARTHRITIS PROFILE
Cyclic Citrullin Peptide Ab: >250 U — ABNORMAL HIGH (ref 0–19)
Rheumatoid fact SerPl-aCnc: 63.8 [IU]/mL — ABNORMAL HIGH (ref <14.0)

## 2023-08-25 LAB — COMPREHENSIVE METABOLIC PANEL
ALT: 12 [IU]/L (ref 0–32)
AST: 17 [IU]/L (ref 0–40)
Albumin: 4.5 g/dL (ref 3.9–4.9)
Alkaline Phosphatase: 86 [IU]/L (ref 44–121)
BUN/Creatinine Ratio: 12 (ref 9–23)
BUN: 9 mg/dL (ref 6–24)
Bilirubin Total: 0.4 mg/dL (ref 0.0–1.2)
CO2: 21 mmol/L (ref 20–29)
Calcium: 9.9 mg/dL (ref 8.7–10.2)
Chloride: 104 mmol/L (ref 96–106)
Creatinine, Ser: 0.74 mg/dL (ref 0.57–1.00)
Globulin, Total: 3 g/dL (ref 1.5–4.5)
Glucose: 83 mg/dL (ref 70–99)
Potassium: 4.5 mmol/L (ref 3.5–5.2)
Sodium: 139 mmol/L (ref 134–144)
Total Protein: 7.5 g/dL (ref 6.0–8.5)
eGFR: 105 mL/min/{1.73_m2} (ref >59)

## 2023-08-25 LAB — HIV ANTIBODY (ROUTINE TESTING W REFLEX): HIV Screen 4th Generation wRfx: NONREACTIVE

## 2023-08-25 LAB — VITAMIN B12: Vitamin B-12: 437 pg/mL (ref 232–1245)

## 2023-08-25 LAB — FOLATE: Folate: 3.6 ng/mL (ref >3.0)

## 2023-08-25 LAB — C-REACTIVE PROTEIN: CRP: 6 mg/L (ref 0–10)

## 2023-08-25 LAB — RPR: RPR Ser Ql: NONREACTIVE

## 2023-08-25 LAB — SEDIMENTATION RATE: Sed Rate: 9 mm/h (ref 0–32)

## 2023-08-25 LAB — CK: Total CK: 92 U/L (ref 32–182)

## 2023-08-25 LAB — ANA W/REFLEX IF POSITIVE: Anti Nuclear Antibody (ANA): NEGATIVE

## 2023-08-25 LAB — TSH: TSH: 0.929 u[IU]/mL (ref 0.450–4.500)

## 2023-08-25 LAB — URIC ACID: Uric Acid: 5.7 mg/dL (ref 2.6–6.2)

## 2023-09-07 ENCOUNTER — Telehealth: Payer: Self-pay | Admitting: Neurology

## 2023-09-07 MED ORDER — GABAPENTIN 300 MG PO CAPS
300.0000 mg | ORAL_CAPSULE | Freq: Three times a day (TID) | ORAL | 0 refills | Status: DC
Start: 2023-09-07 — End: 2023-10-08

## 2023-09-07 NOTE — Telephone Encounter (Signed)
Pt called and LVM wanting to know if the provider can prescribe another medication than the last one prescribed. Please call pt to advise.

## 2023-09-07 NOTE — Telephone Encounter (Signed)
D/c cymbalta and start Gabapentin 300 mg .

## 2023-09-07 NOTE — Addendum Note (Signed)
Addended by: Melvyn Novas on: 09/07/2023 04:45 PM   Modules accepted: Orders

## 2023-09-07 NOTE — Telephone Encounter (Signed)
Returned call to pt who stated that cymbalta and adderall shouldn't be taken together and would like to be prescribed gabapentin instead of cymbalta due to taking in past and pt stated was not herself hallucinating, freaking out. Pt stated she would like an ointment for her left hand nerve pain as well

## 2023-09-07 NOTE — Telephone Encounter (Signed)
Lmtrc and [employer] sent conataining instructions

## 2023-09-08 ENCOUNTER — Telehealth: Payer: Self-pay | Admitting: Diagnostic Neuroimaging

## 2023-09-08 MED ORDER — DICLOFENAC SODIUM 1 % EX GEL: 2.0000 g | Freq: Two times a day (BID) | CUTANEOUS | 0 refills | Status: AC | PRN

## 2023-09-08 NOTE — Telephone Encounter (Signed)
 Patient called in for diclofenac  for pain.  Advised patient that this is available over-the-counter.  Patient prefers prescription for this.  Will send in x 1 and defer future refills to PCP.  Meds ordered this encounter  Medications   diclofenac  Sodium (VOLTAREN ) 1 % GEL    Sig: Apply 2 g topically 2 (two) times daily as needed (arthritis pain).    Dispense:  100 g    Refill:  0   EDUARD FABIENE HANLON, MD 09/08/2023, 1:43 PM Certified in Neurology, Neurophysiology and Neuroimaging  [facility] [address], [address] [place], [place] [address] [phone]

## 2023-10-08 ENCOUNTER — Other Ambulatory Visit: Payer: Self-pay | Admitting: Neurology

## 2023-10-08 NOTE — Telephone Encounter (Signed)
Last seen on 08/17/23 No follow up scheduled

## 2023-11-17 ENCOUNTER — Telehealth: Payer: Self-pay | Admitting: Neurology

## 2023-11-17 MED ORDER — GABAPENTIN 300 MG PO CAPS: 300.0000 mg | ORAL_CAPSULE | Freq: Three times a day (TID) | ORAL | 0 refills | Status: DC

## 2023-11-17 NOTE — Telephone Encounter (Signed)
 Pt called stating that she is needing her gabapentin (NEURONTIN) 300 MG capsule sent in to the new pharmacy that is on file. [facility]

## 2023-12-16 ENCOUNTER — Other Ambulatory Visit: Payer: Self-pay | Admitting: Neurology

## 2023-12-21 NOTE — Progress Notes (Deleted)
 Office Visit Note  Patient: Emily Rojas             Date of Birth: 13-Jul-1981           MRN: [id number]             PCP: Jimmy Moulding, MD Referring: Phebe Brasil, MD Visit Date: 01/04/2024 Occupation: @GUAROCC @  Subjective:  No chief complaint on file.   History of Present Illness: Emily Rojas is a 43 y.o. female ***     Activities of Daily Living:  Patient reports morning stiffness for *** {minute/hour:19697}.   Patient {ACTIONS;DENIES/REPORTS:21021675::"Denies"} nocturnal pain.  Difficulty dressing/grooming: {ACTIONS;DENIES/REPORTS:21021675::"Denies"} Difficulty climbing stairs: {ACTIONS;DENIES/REPORTS:21021675::"Denies"} Difficulty getting out of chair: {ACTIONS;DENIES/REPORTS:21021675::"Denies"} Difficulty using hands for taps, buttons, cutlery, and/or writing: {ACTIONS;DENIES/REPORTS:21021675::"Denies"}  No Rheumatology ROS completed.   PMFS History:  Patient Active Problem List   Diagnosis Date Noted   Paresthesia 08/17/2023   Muscle pain 08/17/2023   Tobacco dependence 01/10/2020   Cervical dysplasia 09/02/2017   CIN III (cervical intraepithelial neoplasia III) 09/26/2014   Anxiety state 08/09/2014   Presence of intrauterine contraceptive device (IUD) 08/09/2014   Abnormal Pap smear of cervix 07/25/2014   Overweight (BMI 25.0-29.9) 07/10/2014    Past Medical History:  Diagnosis Date   Depression    Tobacco dependency     No family history on file. No past surgical history on file. Social History   Social History Narrative   Not on file   Immunization History  Administered Date(s) Administered   Influenza,inj,Quad PF,6+ Mos 08/16/2015, 10/06/2016, 07/01/2017   Pneumococcal Polysaccharide-23 06/19/2015     Objective: Vital Signs: There were no vitals taken for this visit.   Physical Exam   Musculoskeletal Exam: ***  CDAI Exam: CDAI Score: -- Patient Global: --; Provider Global: -- Swollen: --; Tender: -- Joint Exam  01/04/2024   No joint exam has been documented for this visit   There is currently no information documented on the homunculus. Go to the Rheumatology activity and complete the homunculus joint exam.  Investigation: No additional findings.  Imaging: No results found.  Recent Labs: Lab Results  Component Value Date   WBC 5.6 08/17/2023   HGB 14.5 08/17/2023   PLT 515 (H) 08/17/2023   NA 139 08/17/2023   K 4.5 08/17/2023   CL 104 08/17/2023   CO2 21 08/17/2023   GLUCOSE 83 08/17/2023   BUN 9 08/17/2023   CREATININE 0.74 08/17/2023   BILITOT 0.4 08/17/2023   ALKPHOS 86 08/17/2023   AST 17 08/17/2023   ALT 12 08/17/2023   PROT 7.5 08/17/2023   ALBUMIN 4.5 08/17/2023   CALCIUM 9.9 08/17/2023   GFRAA >60 12/02/2017   August 17, 2023 immunoglobulins normal, IFE negative, RF 63.8, anti-CCP> 250, ESR 9, CRP normal, ANA negative, uric acid 5.7, B12 normal, RPR nonreactive, HIV nonreactive, B12 normal, folate normal, TSH normal, CK 92, hemoglobin A1c 5.4, vitamin D26.9  Speciality Comments: No specialty comments available.  Procedures:  No procedures performed Allergies: Cymbalta [duloxetine hcl]   Assessment / Plan:     Visit Diagnoses: No diagnosis found.  Orders: No orders of the defined types were placed in this encounter.  No orders of the defined types were placed in this encounter.   Face-to-face time spent with patient was *** minutes. Greater than 50% of time was spent in counseling and coordination of care.  Follow-Up Instructions: No follow-ups on file.   Nicholas Bari, MD  Note - This record has been created using  Animal nutritionist.  Chart creation errors have been sought, but may not always  have been located. Such creation errors do not reflect on  the standard of medical care.

## 2024-01-04 ENCOUNTER — Encounter: Payer: Self-pay | Admitting: Rheumatology

## 2024-01-04 DIAGNOSIS — M0579 Rheumatoid arthritis with rheumatoid factor of multiple sites without organ or systems involvement: Secondary | ICD-10-CM

## 2024-01-04 DIAGNOSIS — E559 Vitamin D deficiency, unspecified: Secondary | ICD-10-CM

## 2024-01-04 DIAGNOSIS — F411 Generalized anxiety disorder: Secondary | ICD-10-CM

## 2024-01-04 DIAGNOSIS — R202 Paresthesia of skin: Secondary | ICD-10-CM

## 2024-01-04 DIAGNOSIS — Z975 Presence of (intrauterine) contraceptive device: Secondary | ICD-10-CM

## 2024-01-04 DIAGNOSIS — E663 Overweight: Secondary | ICD-10-CM

## 2024-01-04 DIAGNOSIS — N879 Dysplasia of cervix uteri, unspecified: Secondary | ICD-10-CM

## 2024-01-04 DIAGNOSIS — F17200 Nicotine dependence, unspecified, uncomplicated: Secondary | ICD-10-CM

## 2024-01-04 DIAGNOSIS — M791 Myalgia, unspecified site: Secondary | ICD-10-CM

## 2024-02-04 ENCOUNTER — Ambulatory Visit: Payer: Self-pay | Admitting: Rheumatology

## 2024-02-11 ENCOUNTER — Ambulatory Visit: Admitting: Neurology

## 2024-02-11 ENCOUNTER — Encounter: Payer: Self-pay | Admitting: Neurology

## 2024-02-11 ENCOUNTER — Telehealth: Payer: Self-pay | Admitting: Neurology

## 2024-02-11 VITALS — BP 121/82 | HR 81 | Ht 67.0 in | Wt 204.0 lb

## 2024-02-11 DIAGNOSIS — M25532 Pain in left wrist: Secondary | ICD-10-CM

## 2024-02-11 DIAGNOSIS — R202 Paresthesia of skin: Secondary | ICD-10-CM

## 2024-02-11 DIAGNOSIS — M791 Myalgia, unspecified site: Secondary | ICD-10-CM

## 2024-02-11 MED ORDER — GABAPENTIN 300 MG PO CAPS: 300.0000 mg | ORAL_CAPSULE | Freq: Three times a day (TID) | ORAL | 3 refills | Status: AC

## 2024-02-11 NOTE — Progress Notes (Addendum)
 Chief Complaint  Patient presents with   Follow-up    Rm 14. Follow Up. Alone. Pt is requesting a new referral sent to different nerve doctor due to scheduling errors.  parasthesia muscle pain: Pt reports still in pain in hands and feet. Tries not to overuse them.        ASSESSMENT AND PLAN  Emily Rojas is a 43 y.o. female   Multiple small joints pain, swelling,  Essentially normal neurological examination, left wrist swelling  Positive rheumatoid factor, cyclic Citrulline peptide antibody, refer her to rheumatologist,  Refilled her Gabapentin  300 mg 3 times a day Only return to clinic for new issues  DIAGNOSTIC DATA (LABS, IMAGING, TESTING) - I reviewed patient records, labs, notes, testing and imaging myself where available. [facility], physical serology consistent with rheumatoid arthritis, stage III at presentation, continue follow-up  MEDICAL HISTORY:  Emily Rojas, is a 43 year old right-handed female, seen in request by sports medicine Dr. Kathlynn Sharper for evaluation of wrist pain, her primary care physician is Dr. Lenon, Layman ORN, initial evaluation was on August 17, 2023  History is obtained from the patient and review of electronic medical records. I personally reviewed pertinent available imaging films in PACS.   PMHx of  Daily over the counter Analgesic, Tylenol, ibuprofen or Excedrin, ADHD Smoke  She cleans houses, average 4 hours each day, around 2017, she began to experience left wrist pain, gradually getting worse over the past few years, she has to restrain from using her left hand and arm, rely on her right hand heavily, now she developed right hands joints pain, swelling  She also complains of diffuse body achy pain, bilateral plantar surface pain, occasionally numbness, denies neck pain, denies low back pain no bowel or bladder incontinence, cautious gait she contributed to bilateral plantar surface pain bearing  weight  She was seen by sports physician Dr. Kathlynn in May 2024, attempted EMG nerve conduction study, she was not able to tolerate the study  X ray of right shoulder in May 2024: These show minimal AC degenerative change, normal subacromial space, normal glenohumeral space. No chronic changes at the tuberosity.   Laboratory evaluation in the [facility] system in April 2024, showed positive rheumatoid factor 42.6, normal C-reactive protein of 5, ESR of 7,  UPDATE June 5th 2025: She is not taking gabapentin  300 mg 3 times a day,, which is helpful, worry about the combination of Cymbalta  and Adderall, no longer on Cymbalta , have left her wrist swelling and pain for many years, denies significant paresthesia  Repeat laboratory evaluation showed positive rheumatoid factor, and cyclic Citrulline peptide antibody, was referred to Dr. Dolphus rheumatologist office, but missed appointment twice, would like different refer  PHYSICAL EXAM:   Vitals:   02/11/24 1031  BP: 121/82  Pulse: 81  Weight: 204 lb (92.5 kg)  Height: 5' 7 (1.702 m)     Body mass index is 31.95 kg/m.  PHYSICAL EXAMNIATION:  Gen: NAD, conversant, well nourised, well groomed                     Cardiovascular: Regular rate rhythm, no peripheral edema, warm, nontender. Eyes: Conjunctivae clear without exudates or hemorrhage Neck: Supple, no carotid bruits. Pulmonary: Clear to auscultation bilaterally   NEUROLOGICAL EXAM:  MENTAL STATUS: Speech/cognition: Awake, alert, oriented to history taking and casual conversation CRANIAL NERVES: CN II: Visual fields are full to confrontation. Pupils are round equal and briskly reactive to light. CN III,  IV, VI: extraocular movement are normal. No ptosis. CN V: Facial sensation is intact to light touch CN VII: Face is symmetric with normal eye closure  CN VIII: Hearing is normal to causal conversation. CN IX, X: Phonation is normal. CN XI: Head turning and shoulder shrug are  intact  MOTOR: Right hand multiple joints swelling, mild tenderness upon deep palpitation, bilateral wrist pain, left worse than right, noticeable swelling on the left wrist, no significant muscle weakness  REFLEXES: Reflexes are 2+ and symmetric at the biceps, triceps, knees, and ankles. Plantar responses are flexor.  SENSORY: Intact to light touch, pinprick and vibratory sensation are intact in fingers and toes.  COORDINATION: There is no trunk or limb dysmetria noted.  GAIT/STANCE: Able to get up from seated position arm crossed, steady  REVIEW OF SYSTEMS:  Full 14 system review of systems performed and notable only for as above All other review of systems were negative.   ALLERGIES: Allergies  Allergen Reactions   Cymbalta  [Duloxetine  Hcl]     hallucination    HOME MEDICATIONS: Current Outpatient Medications  Medication Sig Dispense Refill   albuterol (VENTOLIN HFA) 108 (90 Base) MCG/ACT inhaler Inhale 2 puffs into the lungs every 4 (four) hours as needed for shortness of breath or wheezing.     amphetamine-dextroamphetamine (ADDERALL) 20 MG tablet Take 20 mg by mouth 3 (three) times daily.     diclofenac  Sodium (VOLTAREN ) 1 % GEL Apply 2 g topically 2 (two) times daily as needed (arthritis pain). 100 g 0   DULoxetine  (CYMBALTA ) 30 MG capsule Take 1 capsule (30 mg total) by mouth daily. 30 capsule 0   DULoxetine  (CYMBALTA ) 60 MG capsule Take 1 capsule (60 mg total) by mouth daily. 90 capsule 3   gabapentin  (NEURONTIN ) 300 MG capsule TAKE 1 CAPSULE(300 MG) BY MOUTH THREE TIMES DAILY 90 capsule 0   ibuprofen (ADVIL) 200 MG tablet Take by mouth.     ibuprofen (ADVIL) 800 MG tablet Take by mouth.     naproxen  (NAPROSYN ) 500 MG tablet Take 1 tablet (500 mg total) by mouth 2 (two) times daily with a meal. 20 tablet 0   No current facility-administered medications for this visit.    PAST MEDICAL HISTORY: Past Medical History:  Diagnosis Date   Depression    Tobacco  dependency     PAST SURGICAL HISTORY: History reviewed. No pertinent surgical history.  FAMILY HISTORY: History reviewed. No pertinent family history.  SOCIAL HISTORY: Social History   Socioeconomic History   Marital status: Single    Spouse name: Not on file   Number of children: 1   Years of education: Not on file   Highest education level: Associate degree: occupational, Scientist, product/process development, or vocational program  Occupational History   Not on file  Tobacco Use   Smoking status: Every Day   Smokeless tobacco: Never  Vaping Use   Vaping status: Never Used  Substance and Sexual Activity   Alcohol use: Not Currently   Drug use: Not Currently   Sexual activity: Not on file  Other Topics Concern   Not on file  Social History Narrative   Not on file   Social Drivers of Health   Financial Resource Strain: Low Risk  (06/18/2023)   Received from [facility]   Overall Financial Resource Strain (CARDIA)    Difficulty of Paying Living Expenses: Not hard at all  Food Insecurity: No Food Insecurity (06/18/2023)   Received from [facility]  Hunger Vital Sign    Worried About Running Out of Food in the Last Year: Never true    Ran Out of Food in the Last Year: Never true  Transportation Needs: No Transportation Needs (06/18/2023)   Received from [facility] - Transportation    In the past 12 months, has lack of transportation kept you from medical appointments or from getting medications?: No    Lack of Transportation (Non-Medical): No  Physical Activity: Not on file  Stress: Not on file  Social Connections: Not on file  Intimate Partner Violence: Not on file      Modena Callander, M.D. Ph.D.  [facility] [address] [place], [place] [address] Ph: [phone] Fax: [phone]  CC:  Lenon Layman ORN, MD [address] [facility] [place],  [place] [address]   Lenon Layman ORN, MD

## 2024-02-11 NOTE — Patient Instructions (Signed)
 [facility] Address: [address], [place], [place] [address]  Phone: [phone]

## 2024-02-11 NOTE — Telephone Encounter (Signed)
Referral for Rheumatology fax to [facility]. Phone:[phone], Fax: [phone].

## 2024-02-15 NOTE — Telephone Encounter (Signed)
 Pt asking the referral be sent to [facility] thru [facility], she also gave a fax# of [phone]

## 2024-02-15 NOTE — Telephone Encounter (Signed)
 Referral is being Re faxed to new location  due to Pt Insurance being OON for [facility]  Referral for Rheumatology faxed to [facility]  Phone :[phone] Fax:[phone]

## 2024-02-15 NOTE — Telephone Encounter (Signed)
 Pt  called in regards to referral . PT informed me that Pt insurance is OON  to receive care at  [facility]. Pt is requesting her referral to be sent to a different location near [place] and  [address]  .   [facility]  to follow up was informed that   Rheumatology does not accept [place] Medicaid [employer]   Would like me to send out new referral to different location ?

## 2024-04-18 ENCOUNTER — Ambulatory Visit: Admitting: Neurology

## 2024-05-16 ENCOUNTER — Encounter: Payer: Self-pay | Admitting: Neurology
